# Patient Record
Sex: Male | Born: 1980 | Hispanic: Yes | Marital: Married | Smoking: Current every day smoker
Health system: Southern US, Community
[De-identification: ages and names within clinical notes are randomized; demographics above are authoritative.]

---

## 2017-10-24 ENCOUNTER — Emergency Department (HOSPITAL_COMMUNITY)
Admission: EM | Admit: 2017-10-24 | Discharge: 2017-10-24 | Disposition: A | Discharge status: Home / Self Care | Payer: Self-pay | Attending: Emergency Medicine | Admitting: Emergency Medicine

## 2017-10-24 ENCOUNTER — Encounter (HOSPITAL_COMMUNITY): Payer: Self-pay | Admitting: Emergency Medicine

## 2017-10-24 ENCOUNTER — Other Ambulatory Visit: Payer: Self-pay

## 2017-10-24 ENCOUNTER — Emergency Department (HOSPITAL_COMMUNITY): Payer: Self-pay

## 2017-10-24 DIAGNOSIS — N132 Hydronephrosis with renal and ureteral calculous obstruction: Secondary | ICD-10-CM | POA: Insufficient documentation

## 2017-10-24 DIAGNOSIS — F17210 Nicotine dependence, cigarettes, uncomplicated: Secondary | ICD-10-CM | POA: Insufficient documentation

## 2017-10-24 LAB — COMPREHENSIVE METABOLIC PANEL
ALBUMIN: 4.3 g/dL (ref 3.5–5.0)
ALK PHOS: 63 U/L (ref 38–126)
ALT: 70 U/L — ABNORMAL HIGH (ref 0–44)
ANION GAP: 11 (ref 5–15)
AST: 41 U/L (ref 15–41)
BILIRUBIN TOTAL: 0.4 mg/dL (ref 0.3–1.2)
BUN: 18 mg/dL (ref 6–20)
CALCIUM: 9.5 mg/dL (ref 8.9–10.3)
CO2: 24 mmol/L (ref 22–32)
Chloride: 105 mmol/L (ref 98–111)
Creatinine, Ser: 1.43 mg/dL — ABNORMAL HIGH (ref 0.61–1.24)
GLUCOSE: 119 mg/dL — AB (ref 70–99)
Potassium: 4.3 mmol/L (ref 3.5–5.1)
Sodium: 140 mmol/L (ref 135–145)
TOTAL PROTEIN: 7.3 g/dL (ref 6.5–8.1)

## 2017-10-24 LAB — CBC
HCT: 42.2 % (ref 39.0–52.0)
HEMOGLOBIN: 14 g/dL (ref 13.0–17.0)
MCH: 30.6 pg (ref 26.0–34.0)
MCHC: 33.2 g/dL (ref 30.0–36.0)
MCV: 92.1 fL (ref 78.0–100.0)
Platelets: 267 10*3/uL (ref 150–400)
RBC: 4.58 MIL/uL (ref 4.22–5.81)
RDW: 13 % (ref 11.5–15.5)
WBC: 15.3 10*3/uL — AB (ref 4.0–10.5)

## 2017-10-24 LAB — URINALYSIS, ROUTINE W REFLEX MICROSCOPIC
Bacteria, UA: NONE SEEN
Bilirubin Urine: NEGATIVE
GLUCOSE, UA: NEGATIVE mg/dL
Ketones, ur: NEGATIVE mg/dL
LEUKOCYTES UA: NEGATIVE
NITRITE: NEGATIVE
PROTEIN: 100 mg/dL — AB
RBC / HPF: >50 RBC/hpf — ABNORMAL HIGH (ref 0–5)
Specific Gravity, Urine: 1.03 (ref 1.005–1.030)
pH: 5 (ref 5.0–8.0)

## 2017-10-24 LAB — I-STAT CREATININE, ED: Creatinine, Ser: 1.2 mg/dL (ref 0.61–1.24)

## 2017-10-24 LAB — LIPASE, BLOOD: Lipase: 27 U/L (ref 11–51)

## 2017-10-24 MED ORDER — ONDANSETRON HCL 4 MG PO TABS: 4.0000 mg | ORAL_TABLET | Freq: Three times a day (TID) | ORAL | 0 refills | Status: AC | PRN

## 2017-10-24 MED ORDER — ONDANSETRON HCL 4 MG/2ML IJ SOLN
4.0000 mg | Freq: Once | INTRAMUSCULAR | Status: AC
  Administered 2017-10-24: 4 mg via INTRAVENOUS
  Filled 2017-10-24: qty 2

## 2017-10-24 MED ORDER — TAMSULOSIN HCL 0.4 MG PO CAPS: 0.4000 mg | ORAL_CAPSULE | Freq: Every day | ORAL | 0 refills | Status: AC

## 2017-10-24 MED ORDER — MORPHINE SULFATE (PF) 4 MG/ML IV SOLN
4.0000 mg | Freq: Once | INTRAVENOUS | Status: AC
  Administered 2017-10-24: 4 mg via INTRAVENOUS
  Filled 2017-10-24: qty 1

## 2017-10-24 MED ORDER — SODIUM CHLORIDE 0.9 % IV BOLUS
1000.0000 mL | Freq: Once | INTRAVENOUS | Status: AC
  Administered 2017-10-24: 1000 mL via INTRAVENOUS

## 2017-10-24 MED ORDER — ACETAMINOPHEN 500 MG PO TABS: 500.0000 mg | ORAL_TABLET | Freq: Four times a day (QID) | ORAL | 0 refills | Status: AC | PRN

## 2017-10-24 MED ORDER — KETOROLAC TROMETHAMINE 30 MG/ML IJ SOLN
30.0000 mg | Freq: Once | INTRAMUSCULAR | Status: AC
  Administered 2017-10-24: 30 mg via INTRAVENOUS
  Filled 2017-10-24: qty 1

## 2017-10-24 MED ORDER — IBUPROFEN 600 MG PO TABS: 600.0000 mg | ORAL_TABLET | Freq: Four times a day (QID) | ORAL | 0 refills | Status: AC | PRN

## 2017-10-24 NOTE — ED Notes (Signed)
Patient transported to CT 

## 2017-10-24 NOTE — ED Provider Notes (Signed)
[facility] EMERGENCY DEPARTMENT Provider Note   CSN: [id number] Arrival date & time: 10/24/17  1840     History   Chief Complaint Chief Complaint  Patient presents with  . Flank Pain    HPI Alan Cooper is a 37 y.o. male with no significant past medical history presents today for evaluation of acute onset, progressively worsening right flank pain for 1 day.  Pain is sharp and stabbing, radiates to the right lower quadrant of the abdomen.  He notes associated urinary frequency and dysuria.  He endorses nausea but no vomiting.  Denies fevers, chills, chest pain, shortness of breath, diarrhea, constipation, testicular pain or swelling.  No pain with bowel movements.  No hematuria.  He has not tried anything for his symptoms.  Patient is primarily Spanish-speaking, his friend served as Nurse, learning disability throughout the encounter.  The history is provided by the patient.    History reviewed. No pertinent past medical history.  There are no active problems to display for this patient.   History reviewed. No pertinent surgical history.      Home Medications    Prior to Admission medications   Medication Sig Start Date End Date Taking? Authorizing Provider  acetaminophen (TYLENOL) 500 MG tablet Take 1 tablet (500 mg total) by mouth every 6 (six) hours as needed. 10/24/17   Eesha Schmaltz A, PA-C  ibuprofen (ADVIL,MOTRIN) 600 MG tablet Take 1 tablet (600 mg total) by mouth every 6 (six) hours as needed. 10/24/17   Luevenia Maxin, Otto Felkins A, PA-C  ondansetron (ZOFRAN) 4 MG tablet Take 1 tablet (4 mg total) by mouth every 8 (eight) hours as needed for nausea or vomiting. 10/24/17   Luevenia Maxin, Necie Wilcoxson A, PA-C  tamsulosin (FLOMAX) 0.4 MG CAPS capsule Take 1 capsule (0.4 mg total) by mouth daily after supper for 5 days. 10/24/17 10/29/17  Jeanie Sewer, PA-C    Family History No family history on file.  Social History Social History   Tobacco Use  . Smoking status: Current Every Day Smoker   Packs/day: 0.50    Types: Cigarettes  . Smokeless tobacco: Never Used  Substance Use Topics  . Alcohol use: Not on file  . Drug use: Not on file     Allergies   Patient has no allergy information on record.   Review of Systems Review of Systems  Constitutional: Negative for chills and fever.  Gastrointestinal: Positive for abdominal pain and nausea. Negative for constipation, diarrhea and vomiting.  Genitourinary: Positive for dysuria and flank pain. Negative for genital sores, hematuria, penile pain, penile swelling, scrotal swelling and testicular pain.  All other systems reviewed and are negative.    Physical Exam Updated Vital Signs BP (!) 141/82 (BP Location: Left Arm)   Pulse (!) 58   Temp 98.6 F (37 C) (Oral)   Resp 18   SpO2 99%   Physical Exam  Constitutional: He appears well-developed and well-nourished. No distress.  Resting comfortably in bed  HENT:  Head: Normocephalic and atraumatic.  Eyes: Conjunctivae are normal. Right eye exhibits no discharge. Left eye exhibits no discharge.  Neck: No JVD present. No tracheal deviation present.  Cardiovascular: Normal rate, regular rhythm and normal heart sounds.  Pulmonary/Chest: Effort normal and breath sounds normal.  Abdominal: Soft. Bowel sounds are normal. He exhibits no distension. There is no tenderness. There is no guarding.  Mild right CVA tenderness, Murphy sign absent, Rovsing's absent, no tenderness palpation McBurney's point  Musculoskeletal: He exhibits tenderness. He exhibits no edema.  No midline spine tenderness, right paralumbar muscle tenderness with no deformity, crepitus, or step-off noted  Neurological: He is alert.  Skin: Skin is warm and dry. No erythema.  Psychiatric: He has a normal mood and affect. His behavior is normal.  Nursing note and vitals reviewed.    ED Treatments / Results  Labs (all labs ordered are listed, but only abnormal results are displayed) Labs Reviewed    COMPREHENSIVE METABOLIC PANEL - Abnormal; Notable for the following components:      Result Value   Glucose, Bld 119 (*)    Creatinine, Ser 1.43 (*)    ALT 70 (*)    All other components within normal limits  CBC - Abnormal; Notable for the following components:   WBC 15.3 (*)    All other components within normal limits  URINALYSIS, ROUTINE W REFLEX MICROSCOPIC - Abnormal; Notable for the following components:   Color, Urine AMBER (*)    APPearance CLOUDY (*)    Hgb urine dipstick LARGE (*)    Protein, ur 100 (*)    RBC / HPF >50 (*)    All other components within normal limits  URINE CULTURE  LIPASE, BLOOD  I-STAT CREATININE, ED    EKG None  Radiology Ct Renal Stone Study  Result Date: 10/24/2017 CLINICAL DATA:  Right flank pain, nausea, dysuria EXAM: CT ABDOMEN AND PELVIS WITHOUT CONTRAST TECHNIQUE: Multidetector CT imaging of the abdomen and pelvis was performed following the standard protocol without IV contrast. COMPARISON:  None. FINDINGS: Lower chest: No significant pulmonary nodules or acute consolidative airspace disease. Hepatobiliary: Diffuse hepatic steatosis. No definite liver surface irregularity. No liver masses. Normal gallbladder with no radiopaque cholelithiasis. No biliary ductal dilatation. Pancreas: Normal, with no mass or duct dilation. Spleen: Normal size. No mass. Adrenals/Urinary Tract: Normal adrenals. Obstructing 3 mm proximal right lumbar ureteral stone with mild right hydronephrosis. No right renal stones. No additional right ureteral stones. Nonobstructing 7 mm lower left renal stone. No left hydronephrosis. Normal caliber left ureter, with no left ureteral stones. Asymmetric enlargement of the right kidney with asymmetric right perinephric fat stranding. No contour deforming renal masses. Normal nondistended bladder. Stomach/Bowel: Probable small hiatal hernia. Otherwise normal nondistended stomach. Normal caliber small bowel with no small bowel wall  thickening. Normal appendix. Normal large bowel with no diverticulosis, large bowel wall thickening or pericolonic fat stranding. Vascular/Lymphatic: Normal caliber abdominal aorta. No pathologically enlarged lymph nodes in the abdomen or pelvis. Reproductive: Normal size prostate. Other: No pneumoperitoneum, ascites or focal fluid collection. Musculoskeletal: No aggressive appearing focal osseous lesions. Mild thoracolumbar spondylosis. IMPRESSION: 1. Obstructing 3 mm proximal right lumbar ureteral stone with mild right hydronephrosis. 2. Nonobstructing lower left nephrolithiasis. 3. Diffuse hepatic steatosis. Electronically Signed   By: Delbert Phenix M.D.   On: 10/24/2017 20:46    Procedures Procedures (including critical care time)  Medications Ordered in ED Medications  sodium chloride 0.9 % bolus 1,000 mL (0 mLs Intravenous Stopped 10/24/17 2100)  ketorolac (TORADOL) 30 MG/ML injection 30 mg (30 mg Intravenous Given 10/24/17 2021)  ondansetron (ZOFRAN) injection 4 mg (4 mg Intravenous Given 10/24/17 2021)  morphine 4 MG/ML injection 4 mg (4 mg Intravenous Given 10/24/17 2159)     Initial Impression / Assessment and Plan / ED Course  I have reviewed the triage vital signs and the nursing notes.  Pertinent labs & imaging results that were available during my care of the patient were reviewed by me and considered in my medical decision making (see  chart for details).     Patient presents for evaluation of acute onset right flank pain.  Associated dysuria.  He is afebrile, mildly hypertensive in the ED.  Resting comfortably in bed.  UA shows gross hematuria but does not appear acutely infected.  Lab work shows leukocytosis of 15.3, mildly elevated creatinine of 1.43. This improved after fluid bolus, suspect patient was dehydrated.  No peritoneal signs on examination of the abdomen.  CT renal stone study shows an obstructing 3 mm proximal right lumbar ureteral stone with mild right hydronephrosis.   Also has a nonobstructing lower left nephrolithiasis.  On reevaluation, the patient is resting comfortably and states he feels better after fluids, Toradol, morphine, and Zofran.  Serial abdominal examinations remain benign, he is tolerating p.o. fluids in the ED at difficulty. No evidence of infected kidney stone.  I doubt obstruction, perforation, appendicitis, colitis, or other acute surgical abdominal pathology.  Will discharge with NSAIDs, Flomax, Zofran as needed for nausea.  Instructed patient to follow-up with urology in the next 2 to 3 days for reevaluation.  Discussed strict ED return precautions.  Patient and patient's friend verbalized understanding of and agreement with plan and patient is stable for discharge home at this time.  Patient's friend served as Nurse, learning disabilitytranslator throughout the encounter and they refused formal translator services multiple times.  Final Clinical Impressions(s) / ED Diagnoses   Final diagnoses:  Ureteral stone with hydronephrosis    ED Discharge Orders        Ordered    ibuprofen (ADVIL,MOTRIN) 600 MG tablet  Every 6 hours PRN     10/24/17 2202    acetaminophen (TYLENOL) 500 MG tablet  Every 6 hours PRN     10/24/17 2202    tamsulosin (FLOMAX) 0.4 MG CAPS capsule  Daily after supper     10/24/17 2202    ondansetron (ZOFRAN) 4 MG tablet  Every 8 hours PRN     10/24/17 2202       Jeanie SewerFawze, Solymar Grace A, PA-C 10/25/17 0118    Linwood DibblesKnapp, Jon, MD 10/26/17 1424

## 2017-10-24 NOTE — ED Provider Notes (Signed)
Patient placed in Quick Look pathway, seen and evaluated   Chief Complaint: Right flank pain  HPI:   Since yesterday right sided flank pain that wraps around to his abdomen.  IT burns when he pees.  Nausea with out vomiting.   ROS: no fevers  Physical Exam:   Gen: No distress  Neuro: Awake and Alert  Skin: Warm    Focused Exam: Abdomen is not distended.     Initiation of care has begun. The patient has been counseled on the process, plan, and necessity for staying for the completion/evaluation, and the remainder of the medical screening examination    Alan Cooper, Alan Espin W, PA-C 10/24/17 Pollie [id number]    Butler, Michael C, MD 10/25/17 1122

## 2017-10-24 NOTE — Discharge Instructions (Signed)
Alternate 600 mg of ibuprofen and [phone] mg of Tylenol every 3 hours as needed for pain. Do not exceed 4000 mg of Tylenol daily.  Take ibuprofen with food to avoid upset stomach issues.  Take Zofran as needed for nausea.  Wait around 20 minutes to give this medication time to work.  You can take Flomax once daily, this will help you pass your kidney stone.  This medication will make you urinate frequently.  Follow-up with urology for reevaluation of your symptoms and recheck of your kidney function which was mildly abnormal today.  Return to the emergency department immediately for any concerning signs or symptoms develop such as high fevers, persistent vomiting, or worsening pain.

## 2017-10-24 NOTE — ED Notes (Signed)
Urine culture sent down as extra 

## 2017-10-24 NOTE — ED Notes (Signed)
Pt verbalizes understanding of d/c instructions. Pt received prescriptions. Pt ambulatory at d/c with all belongings and with family.   

## 2017-10-24 NOTE — ED Triage Notes (Signed)
Pt has had right sided flank pain that radiates around to the lower abdomen since yesterday. No urinary symptoms.

## 2017-10-25 LAB — URINE CULTURE: Culture: NO GROWTH

## 2019-05-25 IMAGING — CT CT RENAL STONE PROTOCOL
2 of 4 series · 16 of 46 positions shown, 18 images · non-contrast
Comparison: None.

CLINICAL DATA: Right flank pain, nausea, dysuria

EXAM:
CT ABDOMEN AND PELVIS WITHOUT CONTRAST
TECHNIQUE: Multidetector CT imaging of the abdomen and pelvis was performed
following the standard protocol without IV contrast.

[Series 3: ap without · axial · non-contrast · 0.70mm/px · z∈[+663,+1118]mm · 13 of 105 slices shown, 15 images]
[im 7/105  soft-tissue]
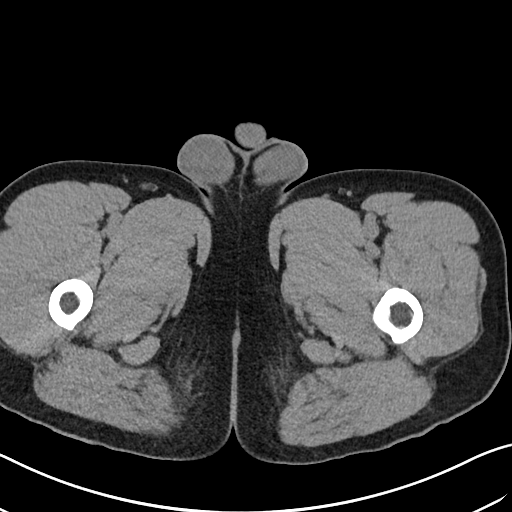
[im 7/105  bone]
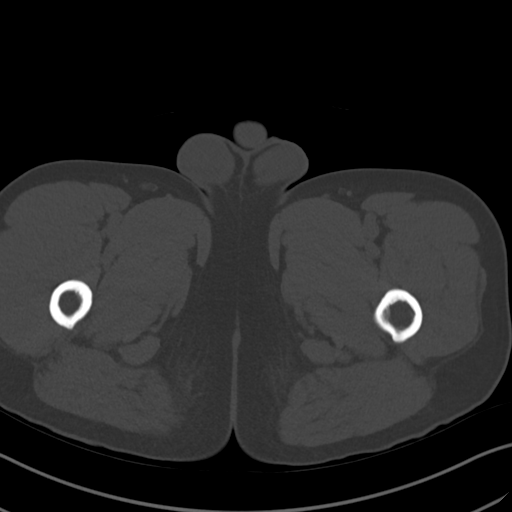
[im 13/105  soft-tissue]
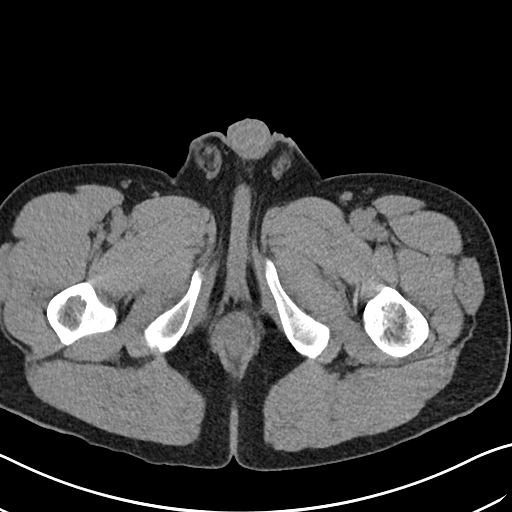
[im 25/105  soft-tissue]
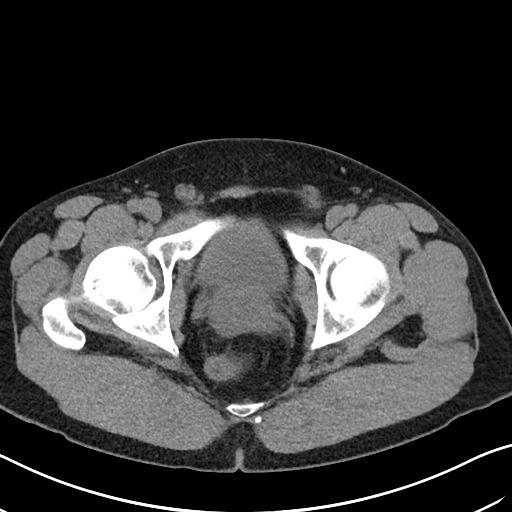
[im 31/105  soft-tissue]
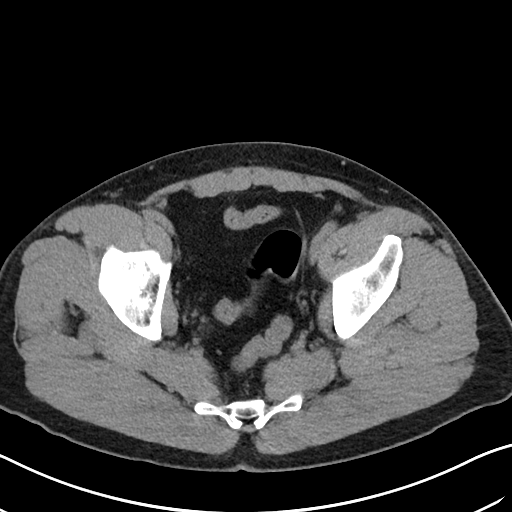
[im 37/105  soft-tissue]
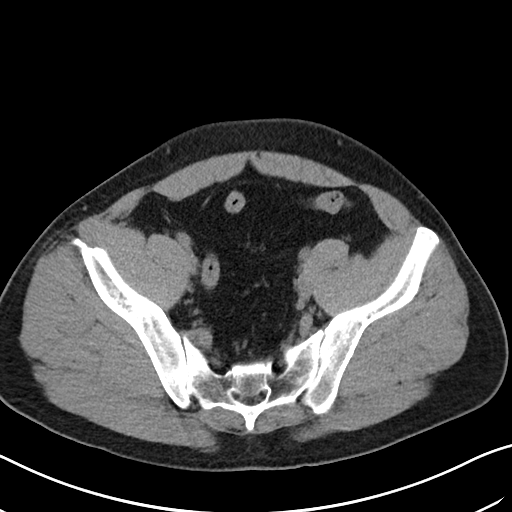
[im 43/105  soft-tissue]
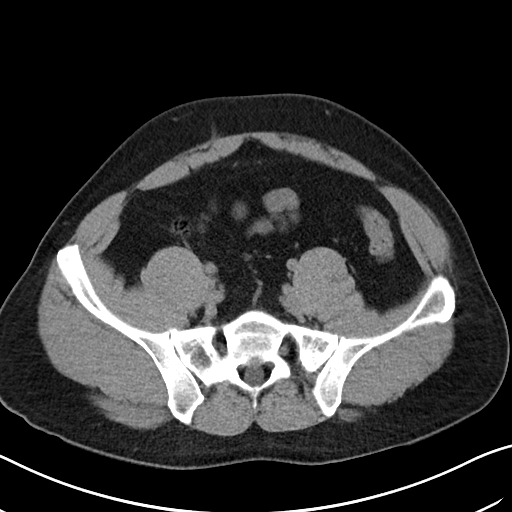
[im 56/105  soft-tissue]
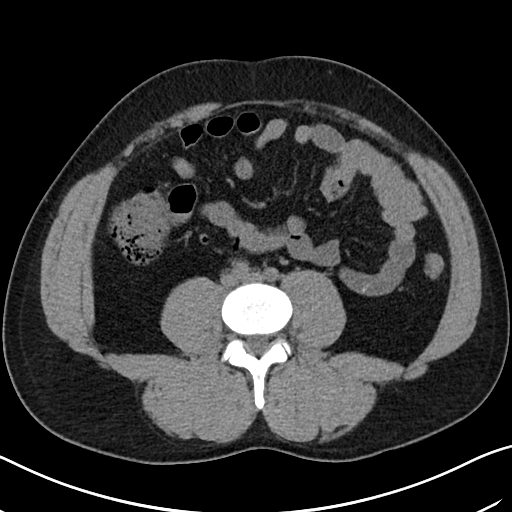
[im 62/105  soft-tissue]
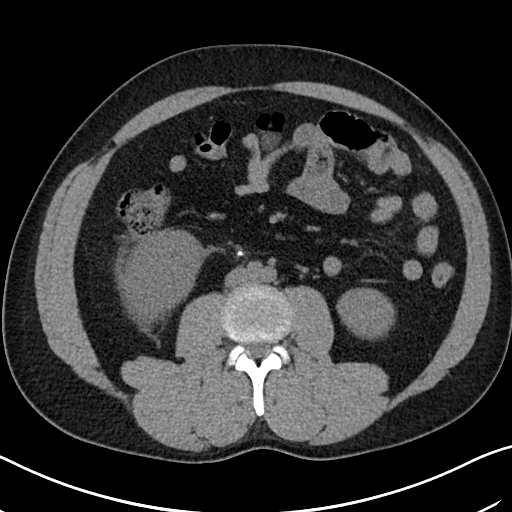
[im 68/105  soft-tissue]
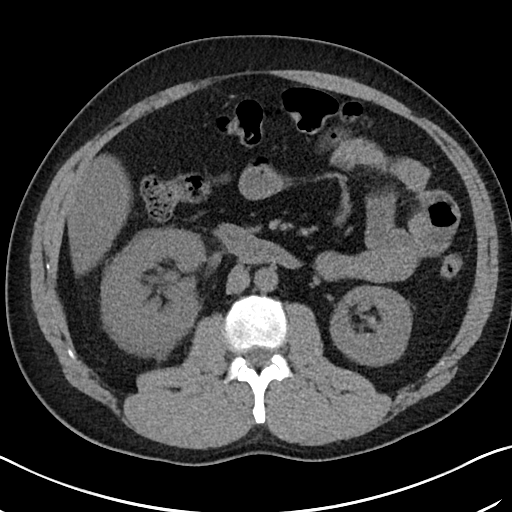
[im 68/105  bone]
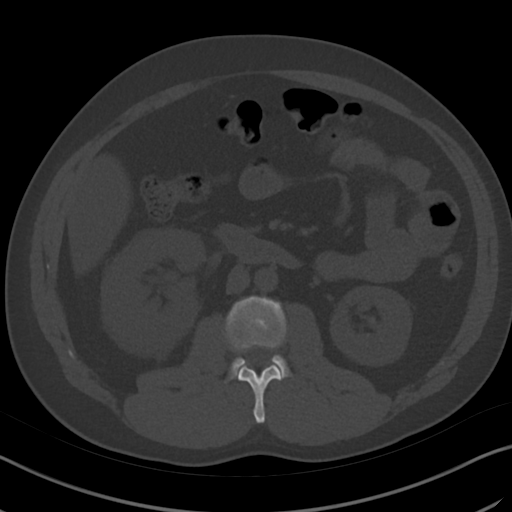
[im 74/105  soft-tissue]
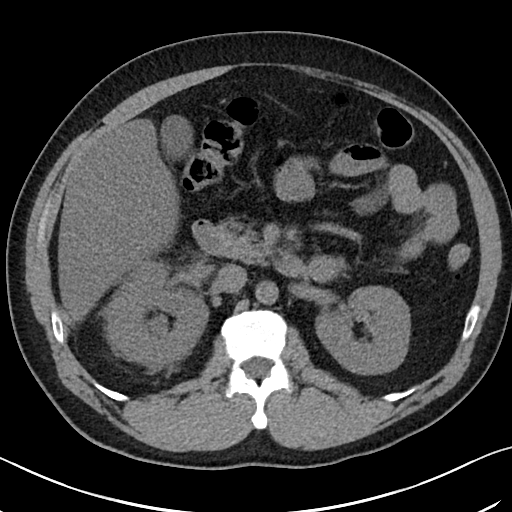
[im 80/105  soft-tissue]
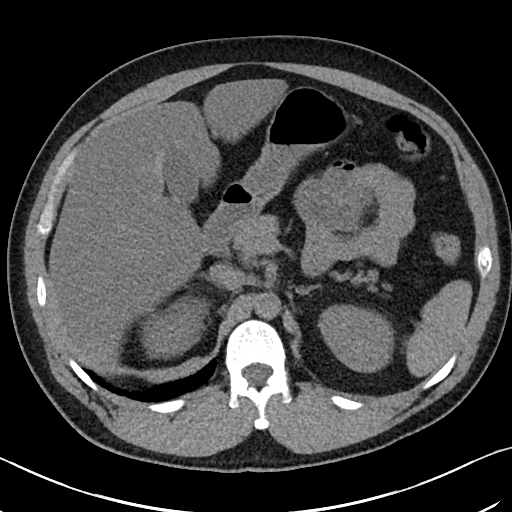
[im 92/105  soft-tissue]
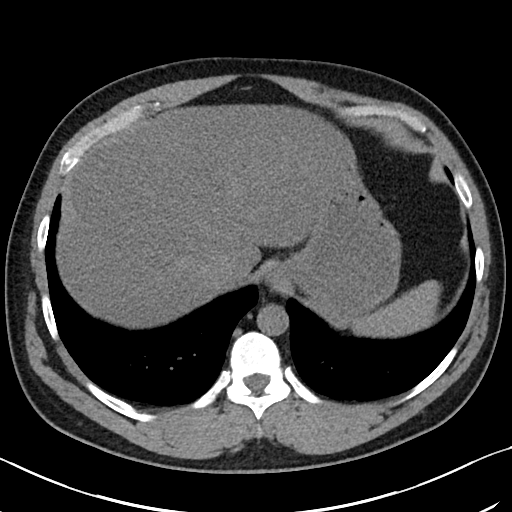
[im 98/105  soft-tissue]
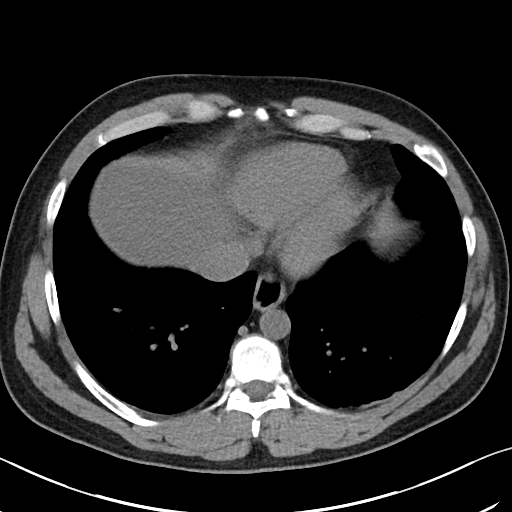

[Series 6: cor · coronal · 0.81mm/px · 3 of 109 slices shown]
[im 37/109  soft-tissue]
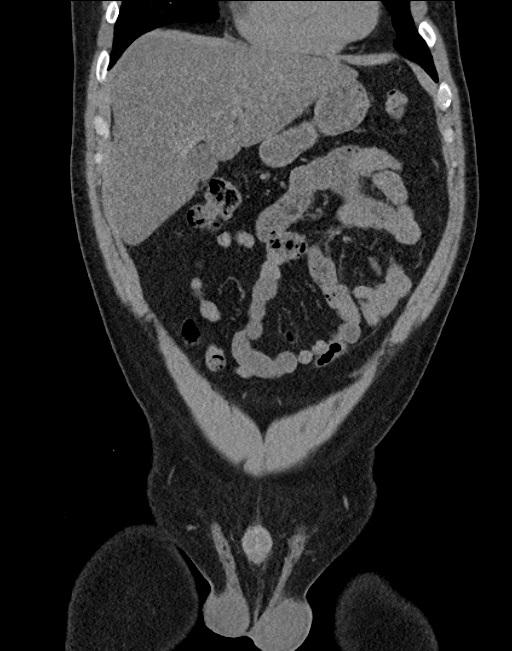
[im 49/109  soft-tissue]
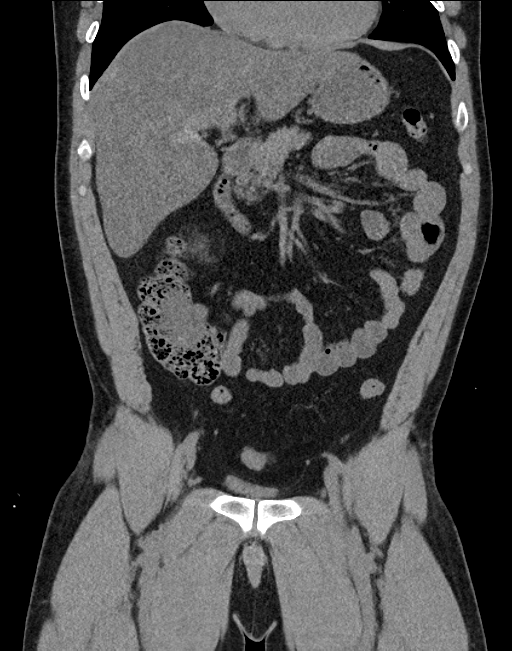
[im 61/109  soft-tissue]
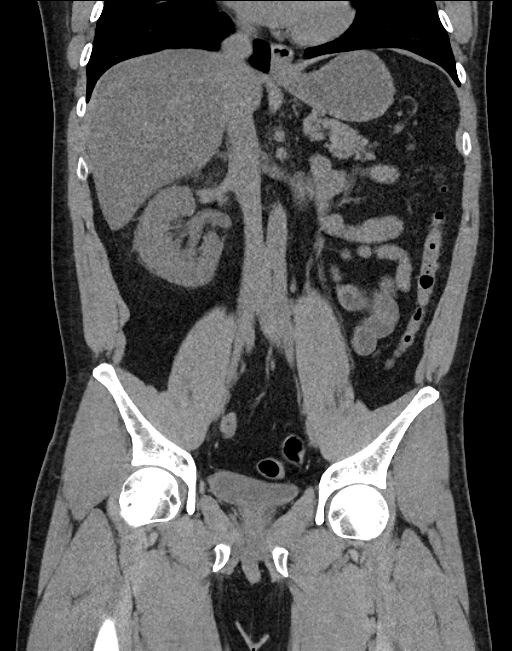

[16 of 46 positions shown; findings below may reference images not displayed]

FINDINGS: Lower chest: No significant pulmonary nodules or acute consolidative
airspace disease.

Hepatobiliary: Diffuse hepatic steatosis. No definite liver surface
irregularity. No liver masses. Normal gallbladder with no radiopaque
cholelithiasis. No biliary ductal dilatation.

Pancreas: Normal, with no mass or duct dilation.

Spleen: Normal size. No mass.

Adrenals/Urinary Tract: Normal adrenals. Obstructing 3 mm proximal
right lumbar ureteral stone with mild right hydronephrosis. No right
renal stones. No additional right ureteral stones. Nonobstructing 7
mm lower left renal stone. No left hydronephrosis. Normal caliber
left ureter, with no left ureteral stones. Asymmetric enlargement of
the right kidney with asymmetric right perinephric fat stranding. No
contour deforming renal masses. Normal nondistended bladder.

Stomach/Bowel: Probable small hiatal hernia. Otherwise normal
nondistended stomach. Normal caliber small bowel with no small bowel
wall thickening. Normal appendix. Normal large bowel with no
diverticulosis, large bowel wall thickening or pericolonic fat
stranding.

Vascular/Lymphatic: Normal caliber abdominal aorta. No
pathologically enlarged lymph nodes in the abdomen or pelvis.

Reproductive: Normal size prostate.

Other: No pneumoperitoneum, ascites or focal fluid collection.

Musculoskeletal: No aggressive appearing focal osseous lesions. Mild
thoracolumbar spondylosis.
IMPRESSION: 1. Obstructing 3 mm proximal right lumbar ureteral stone with mild
right hydronephrosis.
2. Nonobstructing lower left nephrolithiasis.
3. Diffuse hepatic steatosis.
